# Patient Record
Sex: Female | Born: 1955 | Race: White | Marital: Married | Smoking: Never smoker
Health system: Southern US, Community
[De-identification: ages and names within clinical notes are randomized; demographics above are authoritative.]

## PROBLEM LIST (undated history)

## (undated) DIAGNOSIS — R413 Other amnesia: Secondary | ICD-10-CM

## (undated) DIAGNOSIS — M539 Dorsopathy, unspecified: Secondary | ICD-10-CM

## (undated) DIAGNOSIS — F32A Depression, unspecified: Secondary | ICD-10-CM

## (undated) DIAGNOSIS — T7840XA Allergy, unspecified, initial encounter: Secondary | ICD-10-CM

## (undated) DIAGNOSIS — K219 Gastro-esophageal reflux disease without esophagitis: Secondary | ICD-10-CM

## (undated) DIAGNOSIS — E119 Type 2 diabetes mellitus without complications: Secondary | ICD-10-CM

## (undated) DIAGNOSIS — K589 Irritable bowel syndrome without diarrhea: Secondary | ICD-10-CM

## (undated) DIAGNOSIS — F329 Major depressive disorder, single episode, unspecified: Secondary | ICD-10-CM

## (undated) DIAGNOSIS — F419 Anxiety disorder, unspecified: Secondary | ICD-10-CM

## (undated) HISTORY — PX: OTHER SURGICAL HISTORY: SHX169

## (undated) HISTORY — DX: Major depressive disorder, single episode, unspecified: F32.9

## (undated) HISTORY — DX: Gastro-esophageal reflux disease without esophagitis: K21.9

## (undated) HISTORY — DX: Other amnesia: R41.3

## (undated) HISTORY — DX: Dorsopathy, unspecified: M53.9

## (undated) HISTORY — DX: Allergy, unspecified, initial encounter: T78.40XA

## (undated) HISTORY — PX: GALLBLADDER SURGERY: SHX652

## (undated) HISTORY — DX: Depression, unspecified: F32.A

## (undated) HISTORY — DX: Type 2 diabetes mellitus without complications: E11.9

## (undated) HISTORY — PX: ABDOMINAL HYSTERECTOMY: SHX81

## (undated) HISTORY — DX: Anxiety disorder, unspecified: F41.9

## (undated) HISTORY — DX: Irritable bowel syndrome without diarrhea: K58.9

---

## 2005-10-05 ENCOUNTER — Ambulatory Visit: Payer: Self-pay | Admitting: Pain Medicine

## 2005-11-07 ENCOUNTER — Ambulatory Visit: Payer: Self-pay | Admitting: Pain Medicine

## 2005-11-20 ENCOUNTER — Ambulatory Visit: Payer: Self-pay | Admitting: Pain Medicine

## 2005-12-05 ENCOUNTER — Ambulatory Visit: Payer: Self-pay | Admitting: Pain Medicine

## 2005-12-25 ENCOUNTER — Ambulatory Visit: Payer: Self-pay | Admitting: Physician Assistant

## 2006-01-24 ENCOUNTER — Ambulatory Visit: Payer: Self-pay | Admitting: Physician Assistant

## 2006-02-13 ENCOUNTER — Ambulatory Visit: Payer: Self-pay | Admitting: Pain Medicine

## 2006-02-26 ENCOUNTER — Ambulatory Visit: Payer: Self-pay | Admitting: Pain Medicine

## 2006-03-12 ENCOUNTER — Ambulatory Visit: Payer: Self-pay | Admitting: Pain Medicine

## 2006-04-20 ENCOUNTER — Ambulatory Visit: Payer: Self-pay | Admitting: Physician Assistant

## 2006-06-29 ENCOUNTER — Ambulatory Visit: Payer: Self-pay | Admitting: Unknown Physician Specialty

## 2006-08-08 ENCOUNTER — Ambulatory Visit: Payer: Self-pay | Admitting: Pain Medicine

## 2006-08-24 ENCOUNTER — Ambulatory Visit: Payer: Self-pay | Admitting: Physician Assistant

## 2006-10-05 ENCOUNTER — Ambulatory Visit: Payer: Self-pay | Admitting: Physician Assistant

## 2006-11-02 ENCOUNTER — Ambulatory Visit: Payer: Self-pay | Admitting: Physician Assistant

## 2007-01-03 ENCOUNTER — Ambulatory Visit: Payer: Self-pay | Admitting: Pain Medicine

## 2007-02-01 ENCOUNTER — Ambulatory Visit: Payer: Self-pay | Admitting: Physician Assistant

## 2007-02-15 ENCOUNTER — Ambulatory Visit: Payer: Self-pay | Admitting: Physician Assistant

## 2007-03-15 ENCOUNTER — Ambulatory Visit: Payer: Self-pay | Admitting: Physician Assistant

## 2007-04-18 ENCOUNTER — Ambulatory Visit: Payer: Self-pay | Admitting: Pain Medicine

## 2007-05-10 ENCOUNTER — Ambulatory Visit: Payer: Self-pay | Admitting: Physician Assistant

## 2007-06-10 ENCOUNTER — Ambulatory Visit: Payer: Self-pay | Admitting: Physician Assistant

## 2007-07-19 ENCOUNTER — Ambulatory Visit: Payer: Self-pay | Admitting: Physician Assistant

## 2007-08-16 ENCOUNTER — Ambulatory Visit: Payer: Self-pay | Admitting: Physician Assistant

## 2007-10-14 ENCOUNTER — Ambulatory Visit: Payer: Self-pay | Admitting: Pain Medicine

## 2007-11-21 ENCOUNTER — Ambulatory Visit: Payer: Self-pay | Admitting: Physician Assistant

## 2007-12-18 ENCOUNTER — Ambulatory Visit: Payer: Self-pay | Admitting: Pain Medicine

## 2010-11-15 ENCOUNTER — Other Ambulatory Visit: Payer: Self-pay | Admitting: Internal Medicine

## 2010-11-15 DIAGNOSIS — M542 Cervicalgia: Secondary | ICD-10-CM

## 2010-11-19 ENCOUNTER — Ambulatory Visit
Admission: RE | Admit: 2010-11-19 | Discharge: 2010-11-19 | Disposition: A | Discharge status: Home / Self Care | Payer: Medicare Other | Source: Ambulatory Visit | Attending: Internal Medicine | Admitting: Internal Medicine

## 2010-11-19 DIAGNOSIS — M542 Cervicalgia: Secondary | ICD-10-CM

## 2013-10-28 ENCOUNTER — Ambulatory Visit: Payer: Self-pay | Admitting: Podiatrist

## 2013-11-04 ENCOUNTER — Ambulatory Visit (INDEPENDENT_AMBULATORY_CARE_PROVIDER_SITE_OTHER): Payer: Medicare HMO | Admitting: Podiatrist

## 2013-11-04 ENCOUNTER — Encounter: Payer: Self-pay | Admitting: Podiatrist

## 2013-11-04 VITALS — BP 110/68 | HR 93 | Resp 18

## 2013-11-04 DIAGNOSIS — Q828 Other specified congenital malformations of skin: Secondary | ICD-10-CM

## 2013-11-04 DIAGNOSIS — M216X9 Other acquired deformities of unspecified foot: Secondary | ICD-10-CM

## 2013-11-04 DIAGNOSIS — M799 Soft tissue disorder, unspecified: Secondary | ICD-10-CM

## 2013-11-04 NOTE — Progress Notes (Signed)
   Subjective:    Patient ID: Chelsea Johns, female    DOB: 1956/01/03, [age] y.o.   MRN: [id number]  HPI Patient presents today for follow up on painful benign soft tissue lesions of bilateral feet.  She states " The places on my feet are bothering me and they swell and go back down and the right is bigger and hurts to walk on them and hurts my back and it was better after I pulled out the core out of the right foot"  Her most painful lesions are on the right foot.      Review of Systems  HENT:       GERD/ALLERIES  Endocrine: Positive for cold intolerance and heat intolerance.  Musculoskeletal: Positive for back pain and neck pain.  Allergic/Immunologic: Positive for food allergies.       Shell fish  All other systems reviewed and are negative.       Objective:   Physical Exam GENERAL APPEARANCE: Alert, conversant. Appropriately groomed. No acute distress.  VASCULAR: Pedal pulses palpable at 2/4 DP and PT bilateral.  Capillary refill time is immediate to all digits,  Proximal to distal cooling it warm to warm.  Digital hair growth is present bilateral  NEUROLOGIC: sensation is intact epicritically and protectively to 5.07 monofilament at 5/5 sites bilateral.  Light touch is intact bilateral, vibratory sensation intact bilateral, achilles tendon reflex is intact bilateral.  MUSCULOSKELETAL: acceptable muscle strength, tone and stability bilateral.  Intrinsic muscluature intact bilateral.  Rectus appearance of foot and digits noted bilateral.   DERMATOLOGIC: benign soft tissue lesion present submet 3 of the left foot with a small hyperkeratotic lesion overlying.  The left instep has a large soft tissue lesion in the middle of the arch with 2 discreet porokeratotic lesions present which are painful.       Assessment & Plan:  Benign soft tissue lesions with associated keratotic lesions bilateral Plan:  Injected sublesionally with kenalog and marcaine mixture into the right foot to anesthetize  and try and reduce inflammation in the lesion.  The porokeratotic lesions were debrided with a 15 blade without complication.  salinocaine and a dressing was applied.  The lesion on the left foot was debrided as well without anesthesia and without incident.  She will be seen back prn for continued care.  Discussed possibliity of a accomidative insert to offload the lesions as well.

## 2015-08-12 ENCOUNTER — Other Ambulatory Visit: Payer: Self-pay | Admitting: Internal Medicine

## 2015-08-12 DIAGNOSIS — M542 Cervicalgia: Secondary | ICD-10-CM

## 2015-08-12 DIAGNOSIS — M545 Low back pain: Secondary | ICD-10-CM

## 2015-09-15 ENCOUNTER — Ambulatory Visit
Admission: RE | Admit: 2015-09-15 | Discharge: 2015-09-15 | Disposition: A | Discharge status: Home / Self Care | Payer: Medicare HMO | Source: Ambulatory Visit | Attending: Internal Medicine | Admitting: Internal Medicine

## 2015-09-15 DIAGNOSIS — M542 Cervicalgia: Secondary | ICD-10-CM

## 2015-09-15 DIAGNOSIS — M545 Low back pain: Secondary | ICD-10-CM

## 2016-02-08 DIAGNOSIS — M2550 Pain in unspecified joint: Secondary | ICD-10-CM | POA: Insufficient documentation

## 2016-02-08 DIAGNOSIS — M159 Polyosteoarthritis, unspecified: Secondary | ICD-10-CM | POA: Insufficient documentation

## 2016-02-08 DIAGNOSIS — E538 Deficiency of other specified B group vitamins: Secondary | ICD-10-CM | POA: Insufficient documentation

## 2016-02-08 DIAGNOSIS — M5134 Other intervertebral disc degeneration, thoracic region: Secondary | ICD-10-CM | POA: Insufficient documentation

## 2016-02-08 DIAGNOSIS — R682 Dry mouth, unspecified: Secondary | ICD-10-CM | POA: Insufficient documentation

## 2016-02-08 DIAGNOSIS — E7800 Pure hypercholesterolemia, unspecified: Secondary | ICD-10-CM | POA: Insufficient documentation

## 2016-02-08 DIAGNOSIS — E119 Type 2 diabetes mellitus without complications: Secondary | ICD-10-CM | POA: Insufficient documentation

## 2016-02-08 DIAGNOSIS — M5030 Other cervical disc degeneration, unspecified cervical region: Secondary | ICD-10-CM | POA: Insufficient documentation

## 2016-02-08 DIAGNOSIS — M5136 Other intervertebral disc degeneration, lumbar region: Secondary | ICD-10-CM | POA: Insufficient documentation

## 2016-02-08 DIAGNOSIS — H04129 Dry eye syndrome of unspecified lacrimal gland: Secondary | ICD-10-CM | POA: Insufficient documentation

## 2016-10-05 IMAGING — MR MR LUMBAR SPINE W/O CM
4 of 5 series · 19 of 48 positions shown · non-contrast
Comparison: Plain films lumbar spine 08/06/2015. MRI lumbar spine
05/05/2005.

CLINICAL DATA: Chronic low back pain radiating into the right leg
with occasional right leg weakness. Initial encounter.

EXAM:
MRI LUMBAR SPINE WITHOUT CONTRAST
TECHNIQUE: Multiplanar, multisequence MR imaging of the lumbar spine was
performed. No intravenous contrast was administered.

[Series 5: T2 · sagittal · 4.0mm · 0.73mm/px · 7 of 15 slices shown (1 of 2)]
[im 1/15]
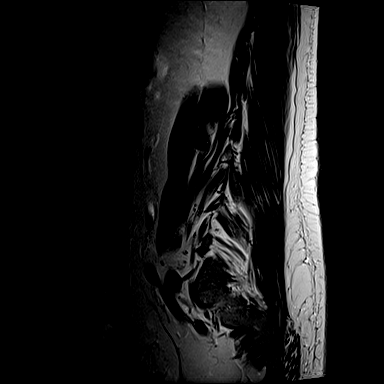
[im 3/15]
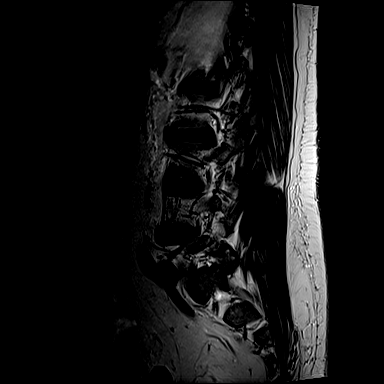
[im 5/15]
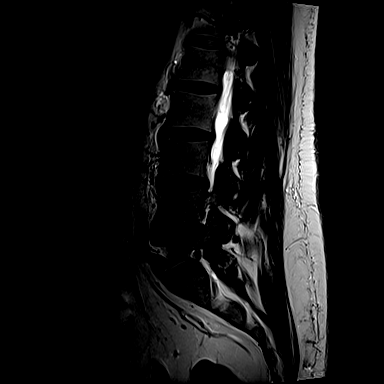
[im 8/15]
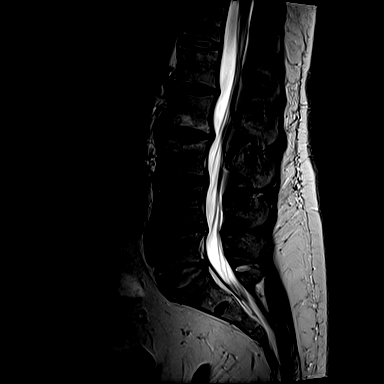
[im 10/15]
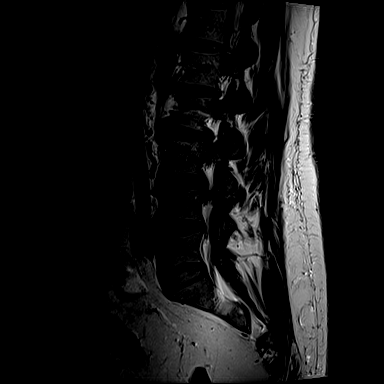
[im 12/15]
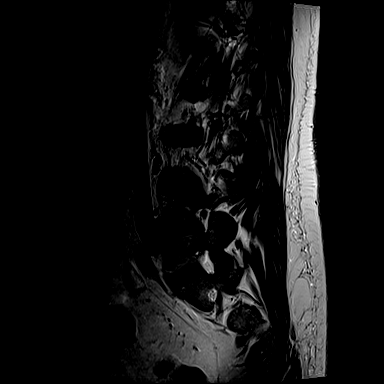
[im 15/15]
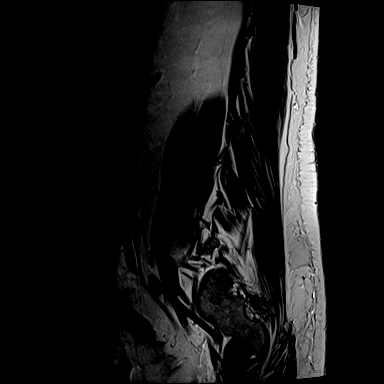

[Series 7: T1 · sagittal · 4.0mm · 0.73mm/px · 3 of 15 slices shown (1 of 2)]
[im 3/15]
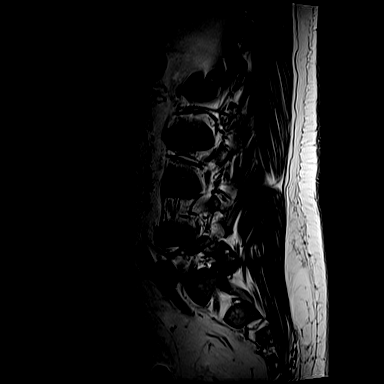
[im 9/15]
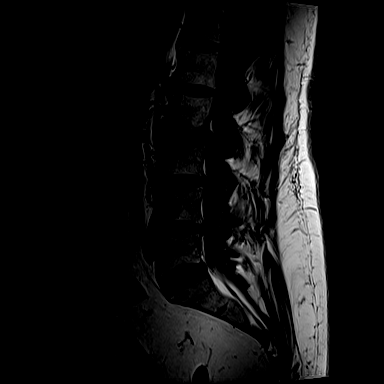
[im 15/15]
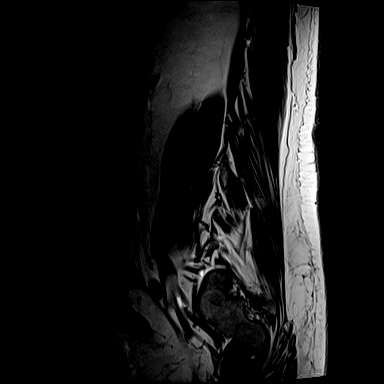

[Series 10: T1 · axial · 4.0mm · 0.28mm/px · z∈[-81,+46]mm · 3 of 33 slices shown (2 of 2)]
[im 5/33]
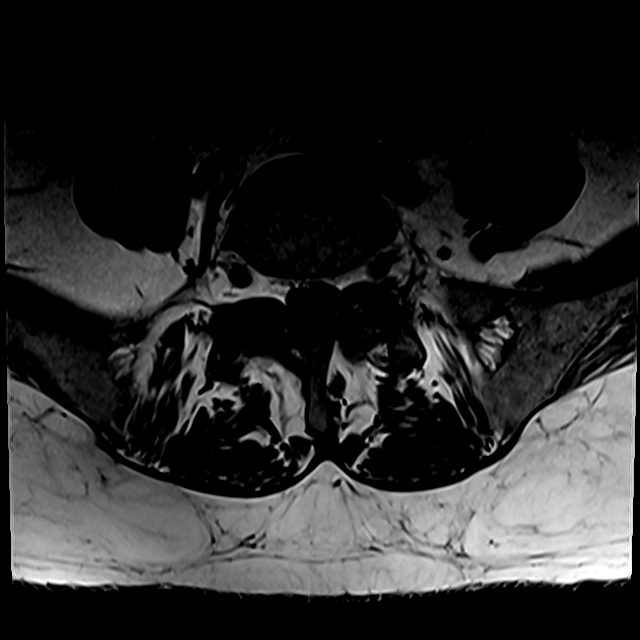
[im 18/33]
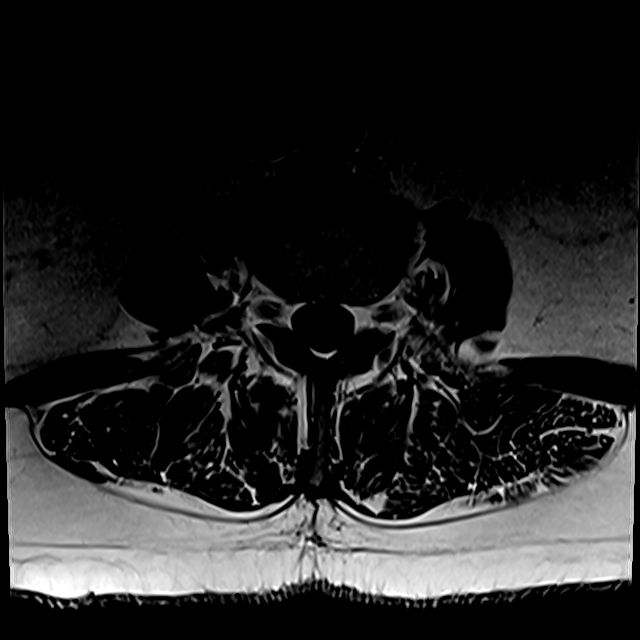
[im 28/33]
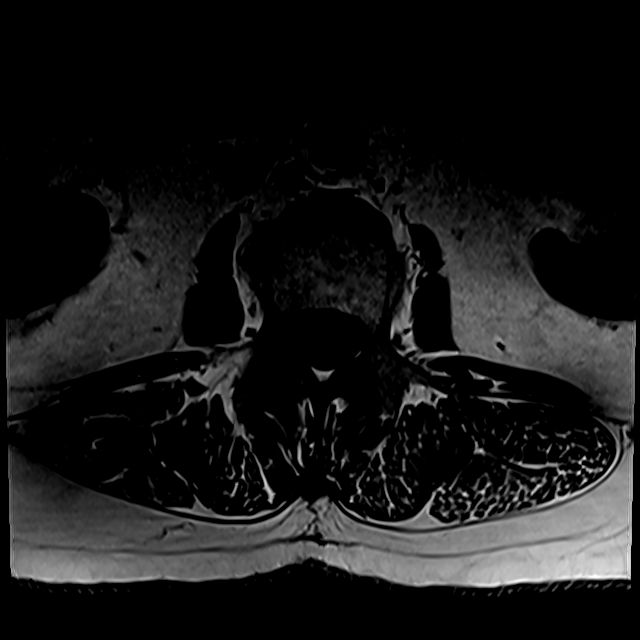

[Series 13: T2 · axial · 4.0mm · 0.28mm/px · z∈[-101,+46]mm · 6 of 33 slices shown (2 of 2)]
[im 1/33]
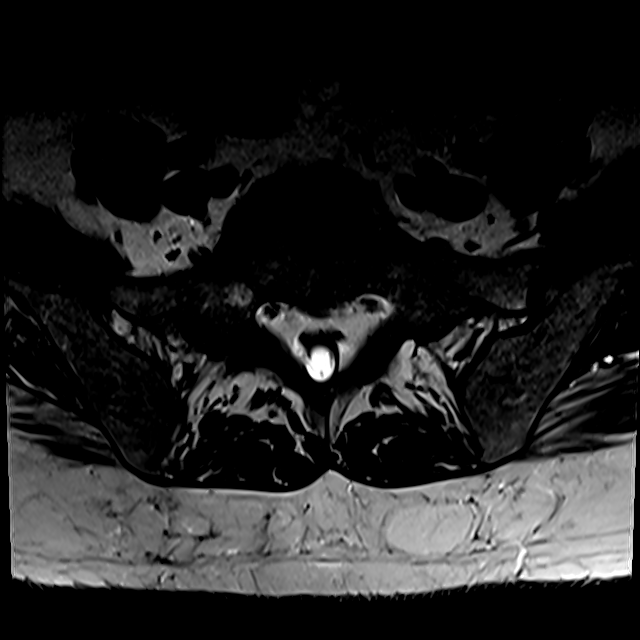
[im 5/33]
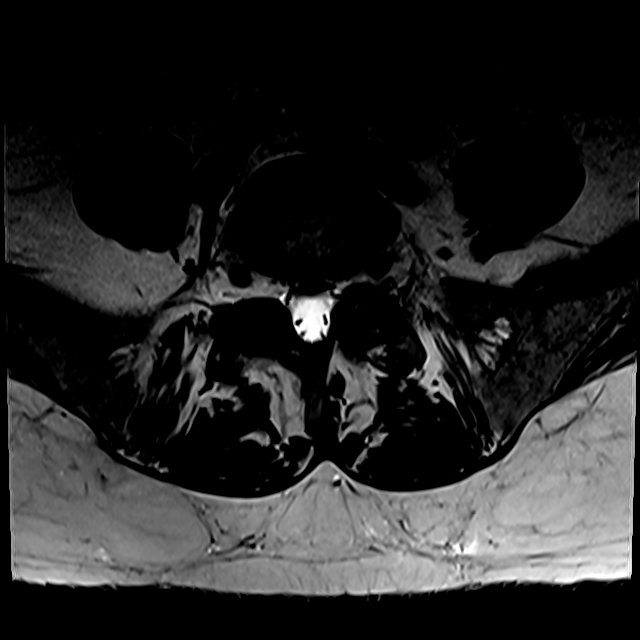
[im 10/33]
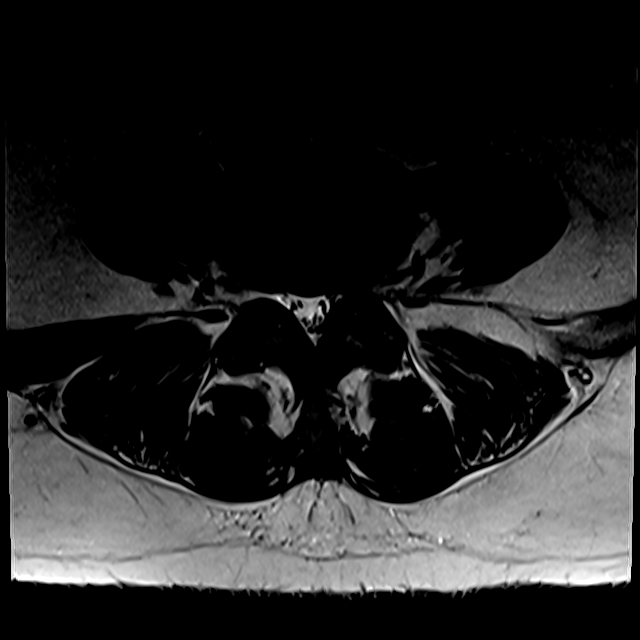
[im 15/33]
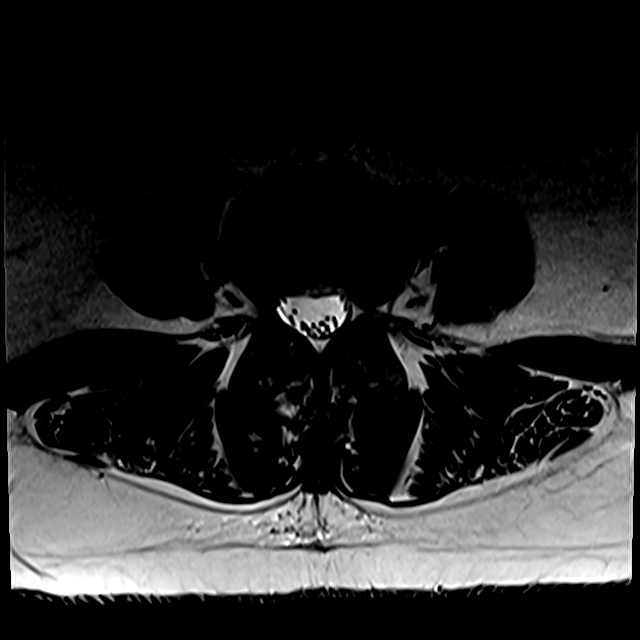
[im 18/33]
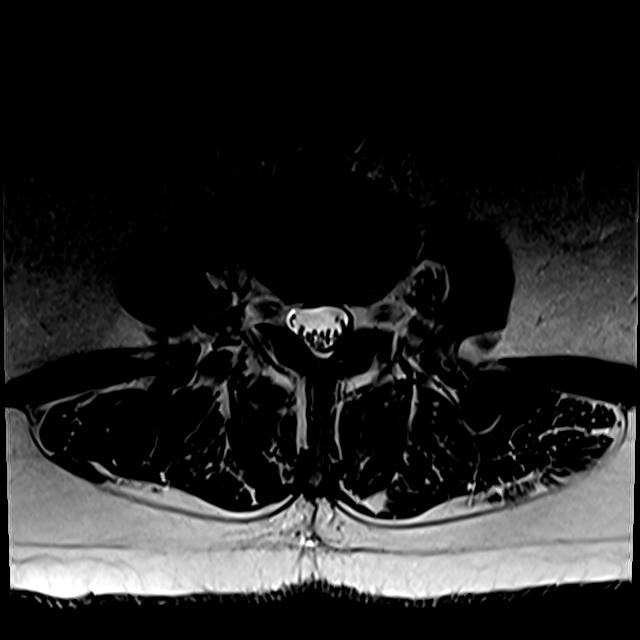
[im 28/33]
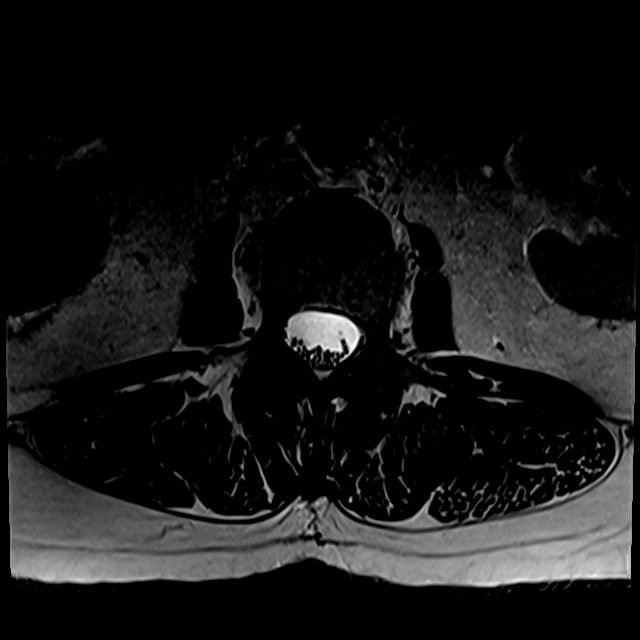

[19 of 48 positions shown; findings below may reference images not displayed]

FINDINGS: There is mild convex left scoliosis. Alignment is maintained.
Vertebral body height is normal. Degenerative endplate signal change
is seen at L2-3. The conus medullaris is normal in signal and
position. Imaged intra-abdominal contents are unremarkable.

T12-L1 is imaged in the sagittal plane only and negative.

L1-2: Minimal central protrusion without central canal or foraminal
narrowing, unchanged.

L2-3: Shallow disc bulge to the right is more prominent than on the
prior study but the central canal and neural foramina remain widely
patent.

L3-4: Tiny right paracentral annular fissure and a shallow disc
bulge are seen. There is some facet degenerative disease. The
central canal and foramina are widely patent. Spondylosis has mildly
progressed at this level.

L4-5: Central annular fissure and small protrusion superimposed on a
shallow disc bulge appear unchanged. The central canal is mildly
narrowed at this level and there is also slight narrowing of the
left lateral recess. The foramina are open.

L5-S1: There is some facet degenerative disease. No disc bulge or
protrusion. The central canal and foramina are widely patent. The
appearance is unchanged.
IMPRESSION: Mild progression of spondylosis at L2-3 and L3-4 without central
canal or foraminal narrowing.

No change in a central annular fissure and small protrusion
superimposed on a disc bulge at L4-5. The central canal and left
lateral recess are mildly narrowed at this level but no nerve root
compression is identified.

## 2016-11-29 ENCOUNTER — Ambulatory Visit (INDEPENDENT_AMBULATORY_CARE_PROVIDER_SITE_OTHER): Payer: Medicare Other

## 2016-11-29 ENCOUNTER — Ambulatory Visit (INDEPENDENT_AMBULATORY_CARE_PROVIDER_SITE_OTHER): Payer: 59 | Admitting: Sports Medicine

## 2016-11-29 ENCOUNTER — Encounter: Payer: Self-pay | Admitting: Sports Medicine

## 2016-11-29 DIAGNOSIS — M779 Enthesopathy, unspecified: Secondary | ICD-10-CM

## 2016-11-29 DIAGNOSIS — L84 Corns and callosities: Secondary | ICD-10-CM

## 2016-11-29 DIAGNOSIS — M79672 Pain in left foot: Principal | ICD-10-CM

## 2016-11-29 DIAGNOSIS — M79671 Pain in right foot: Secondary | ICD-10-CM

## 2016-11-29 DIAGNOSIS — M799 Soft tissue disorder, unspecified: Secondary | ICD-10-CM

## 2016-11-29 DIAGNOSIS — E1142 Type 2 diabetes mellitus with diabetic polyneuropathy: Secondary | ICD-10-CM | POA: Diagnosis not present

## 2016-11-29 DIAGNOSIS — M7989 Other specified soft tissue disorders: Secondary | ICD-10-CM

## 2016-11-29 MED ORDER — TRIAMCINOLONE ACETONIDE 40 MG/ML IJ SUSP: 20.0000 mg | Freq: Once | INTRAMUSCULAR | Status: AC

## 2016-11-29 NOTE — Progress Notes (Signed)
Subjective: Chelsea Johns is a 61 y.o. female patient who presents to office for evaluation of right and left foot pain. Patient complains of progressive pain especially last few years in both feet over the large soft tissue masses. Patient states that she had an ultrasound done a few weeks ago by Dr. Jannette Fogo to evaluate these masses and reports in the past that she has had them injected with a little bit of relief. However, patient desires to have other options discussed. Admits to a history of rheumatoid arthritis  Patient Active Problem List   Diagnosis Date Noted  . DDD (degenerative disc disease), cervical 02/08/2016  . DDD (degenerative disc disease), lumbar 02/08/2016  . DDD (degenerative disc disease), thoracic 02/08/2016  . Dry eye 02/08/2016  . Dry mouth 02/08/2016  . Generalized osteoarthritis of multiple sites 02/08/2016  . Hypercholesteremia 02/08/2016  . Pain in joints 02/08/2016  . T2DM (type 2 diabetes mellitus) ([place]) 02/08/2016  . Vitamin B12 deficiency 02/08/2016    Current Outpatient Prescriptions on File Prior to Visit  Medication Sig Dispense Refill  . fluticasone (FLONASE) 50 MCG/ACT nasal spray     . gabapentin (NEURONTIN) 600 MG tablet     . gemfibrozil (LOPID) 600 MG tablet     . insulin lispro (HUMALOG) 100 UNIT/ML injection Inject into the skin 3 (three) times daily before meals.    . metFORMIN (GLUCOPHAGE) 1000 MG tablet     . traMADol (ULTRAM) 50 MG tablet Take by mouth every 6 (six) hours as needed.     No current facility-administered medications on file prior to visit.     Allergies  Allergen Reactions  . Penicillins   . Sulfa Antibiotics   . Tape   . Penicillin G Rash    Objective:  General: Alert and oriented x3 in no acute distress  Dermatology: Large Focal soft tissue masses plantar aspect of left forefoot and at the right arch Within plantar fascia With mild reactive callus and sub-met 1 on right, No open lesions bilateral lower extremities, no  webspace macerations, no ecchymosis bilateral, all nails x 10 are well manicured.  Vascular: Dorsalis Pedis and Posterior Tibial pedal pulses palpable, Capillary Fill Time 3 seconds,(+) pedal hair growth bilateral, no edema bilateral lower extremities, Temperature gradient within normal limits.  Neurology: Johney Maine sensation intact via light touch bilateral, Protective sensation slightly diminished with Semmes Weinstein Monofilament to all pedal sites, Position sense intact, vibratory slightly diminished bilateral, (- )Tinels sign bilateral.   Musculoskeletal: Mild tenderness with palpation at soft tissue masses bilateral,No pain with calf compression bilateral. Strength within normal limits in all groups bilateral.   Gait: Antalgic gait  Xrays  Right and left Foot   Impression: Normal osseous mineralization, there is no destruction of bone near the areas of soft tissue masses, inferior calcaneal heel spur, no other acute findings.  Assessment and Plan: Problem List Items Addressed This Visit    None    Visit Diagnoses    Pain in both feet    -  Primary   Relevant Medications   triamcinolone acetonide (KENALOG-40) injection 20 mg   Other Relevant Orders   DG Foot 2 Views Left (Completed)   DG Foot 2 Views Right (Completed)   Mass of soft tissue       Relevant Medications   triamcinolone acetonide (KENALOG-40) injection 20 mg   Foot callus       Capsulitis       Relevant Medications   triamcinolone acetonide (KENALOG-40) injection 20  mg   Diabetic polyneuropathy associated with type 2 diabetes mellitus (HCC)       Relevant Medications   ALPRAZolam (XANAX) 1 MG tablet   atorvastatin (LIPITOR) 10 MG tablet   cyclobenzaprine (FLEXERIL) 10 MG tablet   cyclobenzaprine (FLEXERIL) 10 MG tablet   DULoxetine (CYMBALTA) 60 MG capsule   DULoxetine (CYMBALTA) 60 MG capsule   gabapentin (NEURONTIN) 800 MG tablet   Insulin Glargine 300 UNIT/ML SOPN       -Complete examination  performed -Xrays reviewed -Discussed treatement options For large soft tissue mass, likely fibroma versus rheumatoid nodules -After oral consent and aseptic prep, injected a mixture containing 1 ml of 2% plain lidocaine, 1 ml 0.5% plain marcaine, 0.5 ml of kenalog 40 and 0.5 ml of dexamethasone phosphate into right and left mass without complication. Post-injection care discussed with patient.  -Debrided reactive callus lesion on right foot at area of mass along the arch using sterile chisel blade without incident -Gave offloading padding to use as instructed -Advised patient to have ultrasound results to sent to office to plan for possible surgical excision -Also encouraged patient that she will need to have better control of her blood sugar before we start to plan any type of surgery for the soft tissue masses ideally A1c should be 7 -Patient to return to office in 1 month to discuss surgical plan after I review results of ultrasound or sooner if condition worsens.  Landis Martins, DPM

## 2016-12-27 ENCOUNTER — Ambulatory Visit: Payer: Medicare Other | Admitting: Sports Medicine

## 2017-01-19 ENCOUNTER — Telehealth: Payer: Self-pay | Admitting: *Deleted

## 2017-01-19 NOTE — Telephone Encounter (Signed)
Pt's husband, Les Pou states pt received an injection a few weeks ago and it made the pain in her foot worse, what to do. I spoke with pt's husband, Les Pou, he stated pain got worse immediately after the injection. Pt's LOV 11/29/2016, and I asked why she hadn't called or made another appt, we would have wanted to evaluate her for the pain. He states dtr has taken pt to Urgent Care in [place]. I told him that I would not give any new orders due to they may conflict with Urgent Care orders. Carlton asked if she hadn't gone to Urgent Care what would I suggest. I told him I would have suggested ice therapy until I had received orders from Dr. Marylene Land. I told Les Pou to have pt bring record of what was done at Urgent Care to the 01/24/2017 appt and he stated understanding.

## 2017-01-24 ENCOUNTER — Ambulatory Visit: Payer: Medicare Other | Admitting: Sports Medicine

## 2017-06-01 DIAGNOSIS — E119 Type 2 diabetes mellitus without complications: Secondary | ICD-10-CM

## 2017-06-01 DIAGNOSIS — F419 Anxiety disorder, unspecified: Secondary | ICD-10-CM

## 2017-06-01 DIAGNOSIS — N390 Urinary tract infection, site not specified: Secondary | ICD-10-CM

## 2017-06-01 DIAGNOSIS — G92 Toxic encephalopathy: Secondary | ICD-10-CM

## 2020-12-10 ENCOUNTER — Ambulatory Visit: Payer: Medicare Other | Admitting: Sports Medicine

## 2020-12-15 ENCOUNTER — Ambulatory Visit: Payer: Medicare Other | Admitting: Sports Medicine

## 2020-12-15 ENCOUNTER — Other Ambulatory Visit: Payer: Self-pay

## 2020-12-15 ENCOUNTER — Encounter: Payer: Self-pay | Admitting: Sports Medicine

## 2020-12-15 ENCOUNTER — Ambulatory Visit (INDEPENDENT_AMBULATORY_CARE_PROVIDER_SITE_OTHER): Payer: Medicare Other

## 2020-12-15 DIAGNOSIS — M722 Plantar fascial fibromatosis: Secondary | ICD-10-CM

## 2020-12-15 DIAGNOSIS — M79671 Pain in right foot: Secondary | ICD-10-CM | POA: Diagnosis not present

## 2020-12-15 DIAGNOSIS — M7989 Other specified soft tissue disorders: Secondary | ICD-10-CM | POA: Diagnosis not present

## 2020-12-15 DIAGNOSIS — M79672 Pain in left foot: Secondary | ICD-10-CM

## 2020-12-15 NOTE — Progress Notes (Signed)
Subjective: Chelsea Johns is a 65 y.o. female patient who returns to office for evaluation of masses to both feet reports that she has had these grow for years had the left one removed and it came right back during her recovery pain sometimes 10 out of 10 worse with pressure reports that the foot miracle cream has helped with softening of the hard skin however she is concerned about the enlarging size of the mass on the bottom of her left foot.  Patient is diabetic with a history of rheumatoid arthritis  A1c not recorded Fasting blood sugar not recorded  Patient Active Problem List   Diagnosis Date Noted  . DDD (degenerative disc disease), cervical 02/08/2016  . DDD (degenerative disc disease), lumbar 02/08/2016  . DDD (degenerative disc disease), thoracic 02/08/2016  . Dry eye 02/08/2016  . Dry mouth 02/08/2016  . Generalized osteoarthritis of multiple sites 02/08/2016  . Hypercholesteremia 02/08/2016  . Pain in joints 02/08/2016  . T2DM (type 2 diabetes mellitus) ([place]) 02/08/2016  . Vitamin B12 deficiency 02/08/2016    Current Outpatient Medications on File Prior to Visit  Medication Sig Dispense Refill  . cetirizine (ZYRTEC) 10 MG tablet Take 10 mg by mouth daily.  99  . DULoxetine (CYMBALTA) 60 MG capsule TAKE ONE CAPSULE BY MOUTH EVERY DAY 30 DAYS    . gabapentin (NEURONTIN) 800 MG tablet     . Insulin Degludec (TRESIBA) 100 UNIT/ML SOLN Inject into the skin.    Marland Kitchen insulin lispro (HUMALOG) 100 UNIT/ML injection Inject into the skin 3 (three) times daily before meals.    . metFORMIN (GLUCOPHAGE) 1000 MG tablet     . Oxycodone HCl 10 MG TABS      Current Facility-Administered Medications on File Prior to Visit  Medication Dose Route Frequency Provider Last Rate Last Admin  . triamcinolone acetonide (KENALOG-40) injection 20 mg  20 mg Other Once Landis Martins, DPM        Allergies  Allergen Reactions  . Aspirin Shortness Of Breath  . Cashew Nut Oil Anaphylaxis    Peanuts  .  Chocolate Anaphylaxis    "Dark Chocolate Only"  States she can eat milk chocolate.  . Cocoa Anaphylaxis    "Dark Chocolate Only"  States she can eat milk chocolate.  . Shrimp Extract Allergy Skin Test Itching and Swelling  . Sulfa Antibiotics Nausea And Vomiting and Swelling    Swelling of whole body  . Penicillins   . Sulfasalazine   . Tape   . Pedi-Pre Tape Spray [Wound Dressing Adhesive] Rash  . Penicillin G Rash    Objective:  General: Alert and oriented x3 in no acute distress  Dermatology: Large Focal soft tissue masses plantar aspect of left forefoot and at the right arch and submet 1 within plantar fascia With mild reactive callus and sub-met 1 on right, the focal mass on the left foot measures 6 x 3 cm and 2 focal masses on the right foot x3 measures less than 1 cm however the most proximal mass is the largest measuring 2 x 2-1/2 cm, no warmth, no redness, no active drainage, no open lesions bilateral lower extremities, no webspace macerations, no ecchymosis bilateral, all nails x 10 are well manicured.  Vascular: Dorsalis Pedis and Posterior Tibial pedal pulses palpable, Capillary Fill Time 3 seconds,(+) pedal hair growth bilateral, no edema bilateral lower extremities, Temperature gradient within normal limits.  Neurology: Johney Maine sensation intact via light touch bilateral.  Musculoskeletal: Mild tenderness with palpation at soft tissue  masses bilateral,No pain with calf compression bilateral. Strength within normal limits in all groups bilateral.   Gait: Antalgic gait  Xrays  Right and left Foot   Impression: Normal osseous mineralization, there is no destruction of bone near the areas of soft tissue masses, inferior calcaneal heel spur, no other acute findings.  Assessment and Plan: Problem List Items Addressed This Visit   None   Visit Diagnoses    Plantar fibromatosis    -  Primary   Bilateral foot pain       Relevant Orders   DG Foot Complete Right   DG Foot  Complete Left   Soft tissue mass           -Complete examination performed -Xrays reviewed -Discussed treatement options for soft tissue masses likely consistent with recurrent fibroma on the left and plantar fibroma on right -Applied offloading padding to both shoes bilateral -May continue with foot miracle cream to soften any callus skin to the bottoms of both feet -A phone call was made to Dr. Dwana Curd office to see if the ultrasound tech Nicki Reaper) would be able to do repeat ultrasounds to determine the extent of the soft tissue masses bilateral especially on the left -Also reviewed charts from previous surgery May 2019 and pathology which reveals nodular fibromatosis -Patient to return to office after ultrasound or sooner if condition worsens.  Landis Martins, DPM

## 2020-12-16 ENCOUNTER — Other Ambulatory Visit: Payer: Self-pay | Admitting: Sports Medicine

## 2020-12-16 DIAGNOSIS — M722 Plantar fascial fibromatosis: Secondary | ICD-10-CM

## 2021-01-18 ENCOUNTER — Encounter: Payer: Self-pay | Admitting: Sports Medicine

## 2021-01-18 ENCOUNTER — Ambulatory Visit: Payer: Medicare Other | Admitting: Sports Medicine

## 2021-01-18 ENCOUNTER — Other Ambulatory Visit: Payer: Self-pay

## 2021-01-18 DIAGNOSIS — M79671 Pain in right foot: Secondary | ICD-10-CM

## 2021-01-18 DIAGNOSIS — M79672 Pain in left foot: Secondary | ICD-10-CM

## 2021-01-18 DIAGNOSIS — M7989 Other specified soft tissue disorders: Secondary | ICD-10-CM

## 2021-01-18 DIAGNOSIS — M722 Plantar fascial fibromatosis: Secondary | ICD-10-CM | POA: Diagnosis not present

## 2021-01-18 NOTE — Progress Notes (Signed)
Subjective: Chelsea Johns is a 65 y.o. female patient who returns to office for evaluation of masses to both feet reports that they are about the same and the left.  Patient reports that the padding that I gave her last visit because of increased pain but the topical cream of the foot miracle seems to be helping with the callus area to this mass.  Patient is diabetic with a history of rheumatoid arthritis  A1c not recorded Fasting blood sugar not recorded  Patient Active Problem List   Diagnosis Date Noted  . DDD (degenerative disc disease), cervical 02/08/2016  . DDD (degenerative disc disease), lumbar 02/08/2016  . DDD (degenerative disc disease), thoracic 02/08/2016  . Dry eye 02/08/2016  . Dry mouth 02/08/2016  . Generalized osteoarthritis of multiple sites 02/08/2016  . Hypercholesteremia 02/08/2016  . Pain in joints 02/08/2016  . T2DM (type 2 diabetes mellitus) ([place]) 02/08/2016  . Vitamin B12 deficiency 02/08/2016    Current Outpatient Medications on File Prior to Visit  Medication Sig Dispense Refill  . cetirizine (ZYRTEC) 10 MG tablet Take 10 mg by mouth daily.  99  . DULoxetine (CYMBALTA) 60 MG capsule TAKE ONE CAPSULE BY MOUTH EVERY DAY 30 DAYS    . gabapentin (NEURONTIN) 800 MG tablet     . Insulin Degludec (TRESIBA) 100 UNIT/ML SOLN Inject into the skin.    Marland Kitchen insulin lispro (HUMALOG) 100 UNIT/ML injection Inject into the skin 3 (three) times daily before meals.    . metFORMIN (GLUCOPHAGE) 1000 MG tablet     . Oxycodone HCl 10 MG TABS      Current Facility-Administered Medications on File Prior to Visit  Medication Dose Route Frequency Provider Last Rate Last Admin  . triamcinolone acetonide (KENALOG-40) injection 20 mg  20 mg Other Once Landis Martins, DPM        Allergies  Allergen Reactions  . Aspirin Shortness Of Breath  . Cashew Nut Oil Anaphylaxis    Peanuts  . Chocolate Anaphylaxis    "Dark Chocolate Only"  States she can eat milk chocolate.  . Cocoa  Anaphylaxis    "Dark Chocolate Only"  States she can eat milk chocolate.  . Shrimp Extract Allergy Skin Test Itching and Swelling  . Sulfa Antibiotics Nausea And Vomiting, Swelling and Other (See Comments)    Swelling of whole body  . Baclofen Other (See Comments)  . Other Other (See Comments)  . Penicillins   . Sulfasalazine   . Tape   . Penicillin G Rash  . Wound Dressing Adhesive Rash    Objective:  General: Alert and oriented x3 in no acute distress  Dermatology: Large Focal soft tissue masses plantar aspect of left forefoot and at the right arch and submet 1 within plantar fascia With mild reactive callus and sub-met 1 on right, the focal mass on the left foot measures less than 4 cm across and 2 focal masses on the right foot x3 measures less than 1 cm however the most proximal mass is the largest measuring less than 3 cm across relatively same to prior, no warmth, no redness, no active drainage, no open lesions bilateral lower extremities, no webspace macerations, no ecchymosis bilateral, all nails x 10 are well manicured.  Vascular: Dorsalis Pedis and Posterior Tibial pedal pulses palpable, Capillary Fill Time 3 seconds,(+) pedal hair growth bilateral, no edema bilateral lower extremities, Temperature gradient within normal limits.  Neurology: Johney Maine sensation intact via light touch bilateral.  Musculoskeletal: Mild tenderness with palpation at soft tissue  masses bilateral,No pain with calf compression bilateral. Strength within normal limits in all groups bilateral.   Ultrasound consistent with plantar fibromas nodular in nature.  Assessment and Plan: Problem List Items Addressed This Visit   None   Visit Diagnoses    Ledderhose's disease    -  Primary   Plantar fibromatosis       Bilateral foot pain       Soft tissue mass           -Complete examination performed -Ultrasound results reviewed with patient -Discussed treatement options for soft tissue masses secondary  to ledderhose disease -Discussed with patient high rate of recurrence and that likely even with surgery may come back larger -Prescribed physical therapy for shock treatments for these large masses -May continue with foot miracle cream to soften any callus skin to the bottoms of both feet like previous -Recommended supportive shoes daily for foot type -Advised patient if symptoms fail to continue to improve may consider a consult to oncology versus general surgery for plan for excision with radiation -Patient to return to office after physical therapy or sooner if condition worsens.  Landis Martins, DPM

## 2024-07-13 DIAGNOSIS — G9340 Encephalopathy, unspecified: Secondary | ICD-10-CM | POA: Diagnosis not present
